# Patient Record
Sex: Male | Born: 2000
Health system: Southern US, Community
[De-identification: ages and names within clinical notes are randomized; demographics above are authoritative.]

## PROBLEM LIST (undated history)

## (undated) DIAGNOSIS — J45909 Unspecified asthma, uncomplicated: Secondary | ICD-10-CM

## (undated) DIAGNOSIS — K219 Gastro-esophageal reflux disease without esophagitis: Secondary | ICD-10-CM

## (undated) HISTORY — DX: Gastro-esophageal reflux disease without esophagitis: K21.9

## (undated) HISTORY — PX: TYMPANOSTOMY TUBE PLACEMENT: SHX32

## (undated) HISTORY — PX: TONSILLECTOMY: SUR1361

## (undated) HISTORY — PX: ADENOIDECTOMY: SUR15

## (undated) HISTORY — DX: Unspecified asthma, uncomplicated: J45.909

---
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
## 2019-01-05 DIAGNOSIS — G4730 Sleep apnea, unspecified: Secondary | ICD-10-CM | POA: Diagnosis not present

## 2019-01-05 NOTE — Procedures (Signed)
Patient Name: Tyler Strickland, Tyler Strickland Date: 12/20/2018 Gender: Male D.O.B: Jul 14, 2001 Age (years): 17 Referring Provider: Maree Erie Height (inches): 72 Interpreting Physician: Jetty Duhamel MD, ABSM Weight (lbs): 320 RPSGT: Rolene Arbour BMI: 43 MRN: [id number] Neck Size: 19.00  CLINICAL INFORMATION Sleep Study Type: NPSG Indication for sleep study: Snoring Epworth Sleepiness Score: 8  SLEEP STUDY TECHNIQUE As per the AASM Manual for the Scoring of Sleep and Associated Events v2.3 (April 2016) with a hypopnea requiring 4% desaturations.  The channels recorded and monitored were frontal, central and occipital EEG, electrooculogram (EOG), submentalis EMG (chin), nasal and oral airflow, thoracic and abdominal wall motion, anterior tibialis EMG, snore microphone, electrocardiogram, and pulse oximetry.  MEDICATIONS Medications self-administered by patient taken the night of the study : none reported  SLEEP ARCHITECTURE The study was initiated at 9:51:37 PM and ended at 4:30:38 AM.  Sleep onset time was 70.4 minutes and the sleep efficiency was 62.9%%. The total sleep time was 251.1 minutes.  Stage REM latency was 86.0 minutes.  The patient spent 2.8%% of the night in stage N1 sleep, 70.7%% in stage N2 sleep, 0.0%% in stage N3 and 26.5% in REM.  Alpha intrusion was absent.  Supine sleep was 87.65%.  RESPIRATORY PARAMETERS The overall apnea/hypopnea index (AHI) was 4.8 per hour. There were 0 total apneas, including 0 obstructive, 0 central and 0 mixed apneas. There were 20 hypopneas and 10 RERAs.  The AHI during Stage REM sleep was 13.5 per hour.  AHI while supine was 3.5 per hour.  The mean oxygen saturation was 96.7%. The minimum SpO2 during sleep was 86.0%.  moderate snoring was noted during this study.  CARDIAC DATA The 2 lead EKG demonstrated sinus rhythm. The mean heart rate was 76.3 beats per minute. Other EKG findings include: None.  LEG MOVEMENT  DATA The total PLMS were 0 with a resulting PLMS index of 0.0. Associated arousal with leg movement index was 0.0 .  IMPRESSIONS - Occasional obstructive sleep apneas occurred during this study, within normal limits (AHI = 4.8/h). Most events were in REM. - No significant central sleep apnea occurred during this study (CAI = 0.0/h). - Mild oxygen desaturation was noted during this study (Min O2 = 86.0%).Mean O2 sat 96.7%. - The patient snored with moderate snoring volume. - No cardiac abnormalities were noted during this study. - Clinically significant periodic limb movements did not occur during sleep. No significant associated arousals.  DIAGNOSIS - Prinmary snoring  RECOMMENDATIONS - An AHI of 4.8/ hr is at the upper limit of normal. This patient may have mild OSA on some nights, if supine, very tired or with nasal congestion. - Suggest emphasis on sleep position off back, weight loss. Consider retest in the future if clinically indicated. - Be careful with alcohol, sedatives and other CNS depressants that may worsen sleep apnea and disrupt normal sleep architecture. - Sleep hygiene should be reviewed to assess factors that may improve sleep quality. - Weight management and regular exercise should be initiated or continued if appropriate.  [Electronically signed] 01/05/2019 11:55 AM  Jetty Duhamel MD, ABSM Diplomate, American Board of Sleep Medicine   NPI: [id number]                          Jetty Duhamel Diplomate, American Board of Sleep Medicine  ELECTRONICALLY SIGNED ON:  01/05/2019, 11:48 AM [facility] PH: [phone]   FX: [phone] ACCREDITED BY THE AMERICAN ACADEMY  OF SLEEP MEDICINE

## 2019-01-15 ENCOUNTER — Ambulatory Visit (INDEPENDENT_AMBULATORY_CARE_PROVIDER_SITE_OTHER): Payer: PRIVATE HEALTH INSURANCE | Admitting: Pediatrics

## 2019-01-15 ENCOUNTER — Other Ambulatory Visit: Payer: Self-pay

## 2019-01-15 DIAGNOSIS — J302 Other seasonal allergic rhinitis: Secondary | ICD-10-CM | POA: Diagnosis not present

## 2019-01-15 MED ORDER — OLOPATADINE HCL 0.2 % OP SOLN: 1.0000 [drp] | Freq: Every day | OPHTHALMIC | 5 refills | Status: AC

## 2019-01-15 MED ORDER — MONTELUKAST SODIUM 10 MG PO TABS: 10.0000 mg | ORAL_TABLET | Freq: Every day | ORAL | 2 refills | Status: AC

## 2019-01-15 MED FILL — MONTELUKAST SOD 10 MG TAB: 10 | 30 days supply | Qty: 30 | Fill #0

## 2019-01-15 NOTE — Progress Notes (Signed)
Virtual Visit via Telephone Note  I connected with Tyler T Ruslan Brotman 's Strickland  on 01/15/19 at  3:30 PM EDT by telephone and verified that I am speaking with the correct person using two identifiers. Location of patient/parent: phone   I discussed the limitations, risks, security and privacy concerns of performing an evaluation and management service by telephone and the availability of in person appointments. I discussed that the purpose of this phone visit is to provide medical care while limiting exposure to the novel coronavirus.  I also discussed with the patient that there may be a patient responsible charge related to this service. The Strickland expressed understanding and agreed to proceed.  Reason for visit: allergies  History of Present Illness:  Every year patient gets the same symptoms during the spring season  Itchy red eyes Sneezing Congested claritin and zyretec dont work Benadryl is the only thing that works but Mom is concerned about its sedating effects.   no fevers No known contacts to coronavirus   Assessment and Plan:  18 yo M with history of seasonal allergies currently with exacerbation Not responsive to first line antihistamines in past  Will try singulair and ophthalmic antihistamine for symptomatic control Follow up precautions reviewed.   Meds ordered this encounter  Medications  . montelukast (SINGULAIR) 10 MG tablet    Sig: Take 1 tablet (10 mg total) by mouth at bedtime.    Dispense:  30 tablet    Refill:  2  . Olopatadine HCl 0.2 % SOLN    Sig: Apply 1 drop to eye daily.    Dispense:  2.5 mL    Refill:  5     Follow Up Instructions: PRN   I discussed the assessment and treatment plan with the patient and/or parent/guardian. They were provided an opportunity to ask questions and all were answered. They agreed with the plan and demonstrated an understanding of the instructions.   They were advised to call back or seek an in-person evaluation  in the emergency room if the symptoms worsen or if the condition fails to improve as anticipated.  I provided 7 minutes of non-face-to-face time during this encounter. I was located at home office during this encounter.  Ancil Linsey, MD

## 2019-02-06 ENCOUNTER — Telehealth: Payer: Self-pay | Admitting: *Deleted

## 2019-02-06 ENCOUNTER — Encounter: Payer: Self-pay | Admitting: *Deleted

## 2019-02-06 NOTE — Telephone Encounter (Signed)
He had a new patient appt on 02/10/2019 at 7:30am.  Due to COVID-19, our office hours have changed and this appt had to be canceled.  I called him to reschedule.  No answer on his mobile number x 2 and voicemail full.  His mychart sign-up is still pending so not able to email him.  I left a message on his listed home number to let him know the appt has been canceled and provided our number to call back to reschedule.  When he calls back, please offer him a new appt time with Dr. Terrace Arabia.

## 2019-02-10 ENCOUNTER — Ambulatory Visit: Payer: No Typology Code available for payment source | Admitting: Neurology

## 2019-04-07 IMAGING — CT CT HEAD W/O CM
3 of 4 series · 14 of 47 positions shown, 16 images · non-contrast
Comparison: None.

CLINICAL DATA: Headaches and cysts head head collision playing
football [REDACTED] evening.

EXAM:
CT HEAD WITHOUT CONTRAST
TECHNIQUE: Contiguous axial images were obtained from the base of the skull
through the vertex without intravenous contrast.

[Series 2: head w/o · axial · non-contrast · 0.45mm/px · z∈[+1440,+1560]mm · 8 of 30 slices shown, 10 images]
[im 3/30  brain]
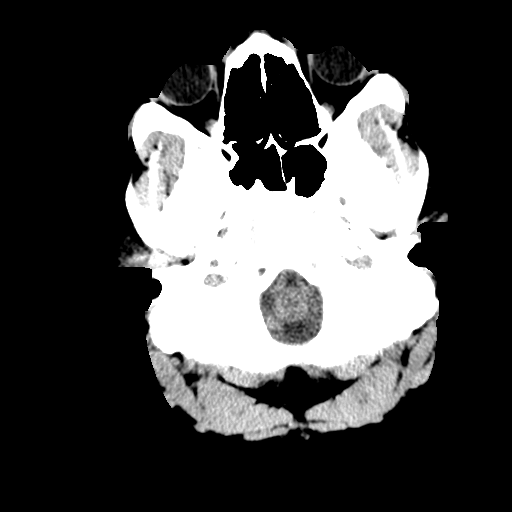
[im 3/30  bone]
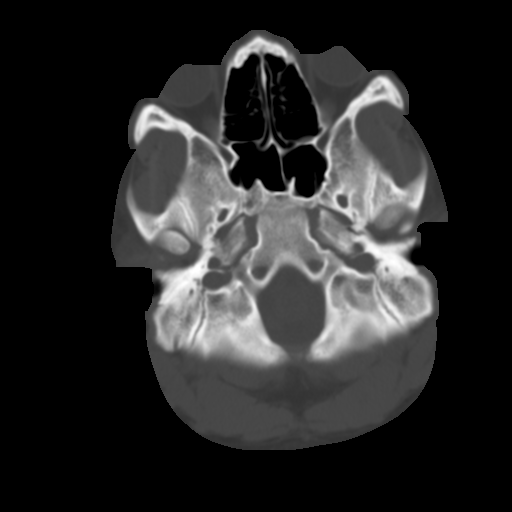
[im 7/30  brain]
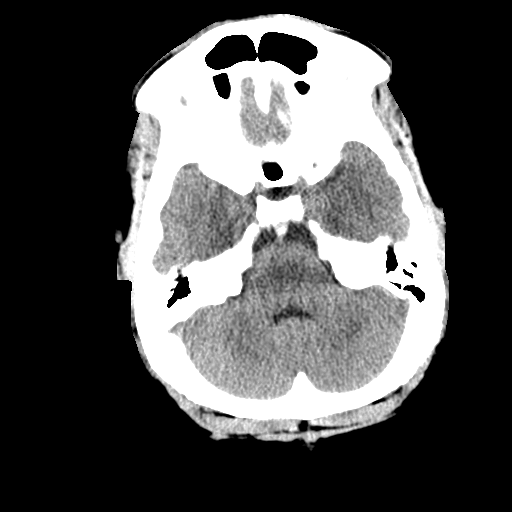
[im 11/30  brain]
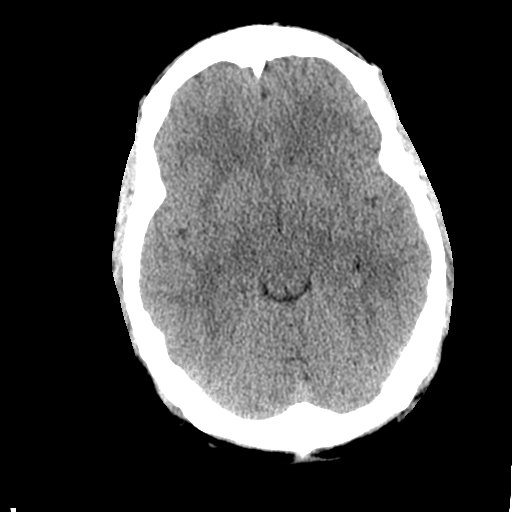
[im 13/30  brain]
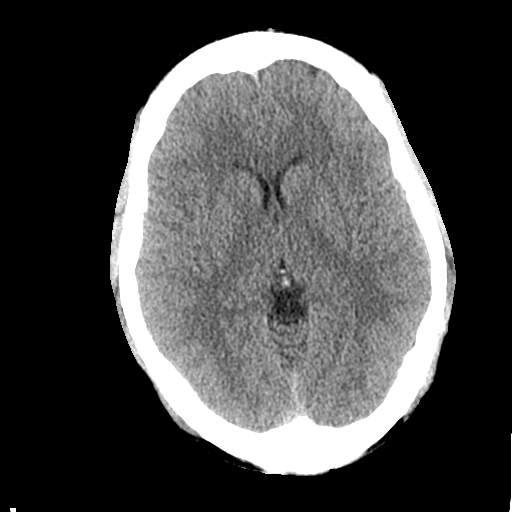
[im 17/30  brain]
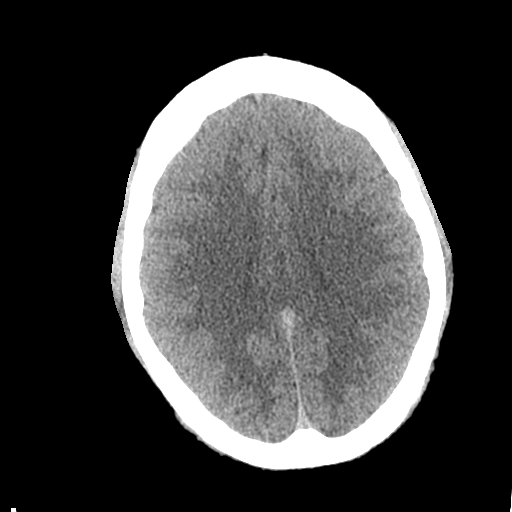
[im 17/30  bone]
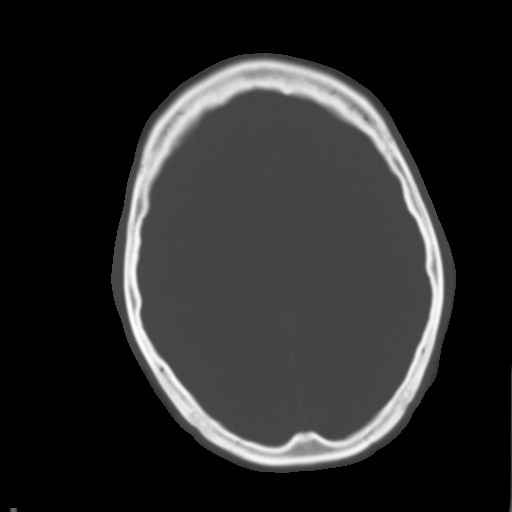
[im 19/30  brain]
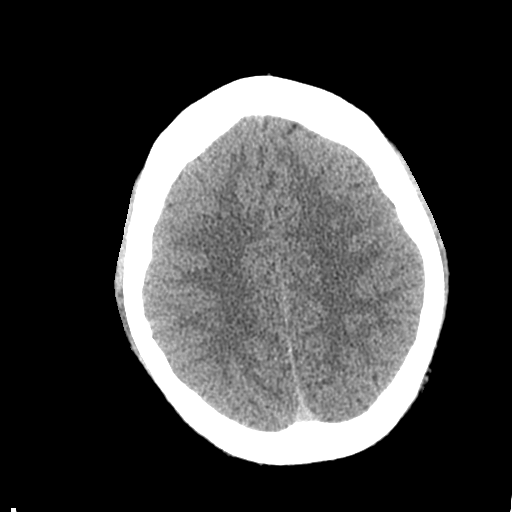
[im 23/30  brain]
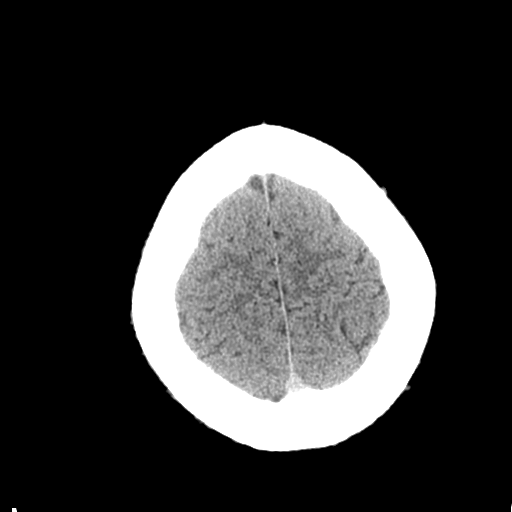
[im 27/30  brain]
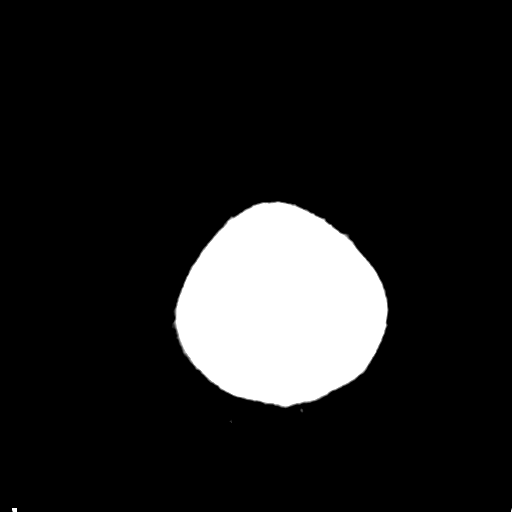

[Series 5: coronal · coronal · 0.29mm/px · 3 of 77 slices shown]
[im 26/77  brain]
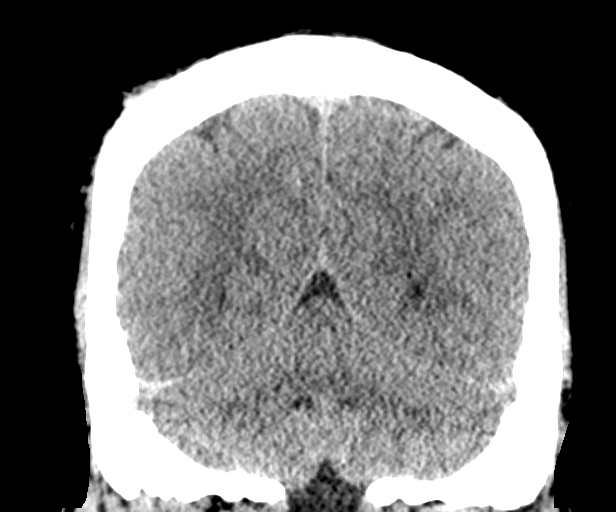
[im 34/77  brain]
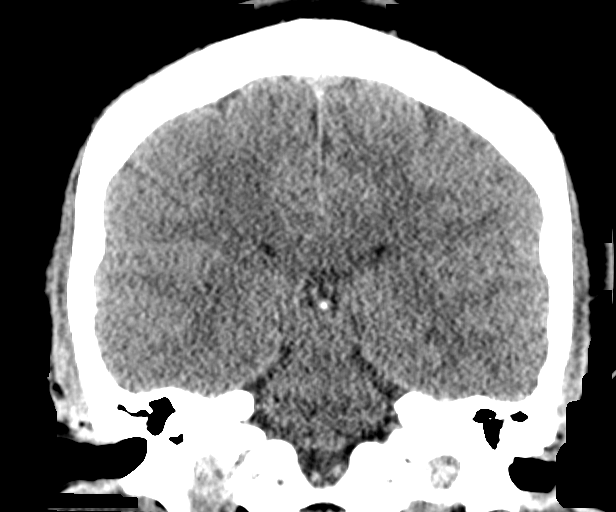
[im 43/77  brain]
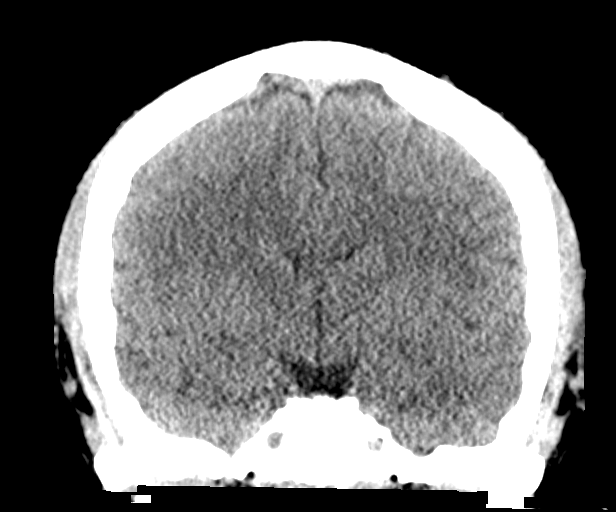

[Series 6: sagittal · sagittal · 0.29mm/px · 3 of 60 slices shown]
[im 20/60  brain]
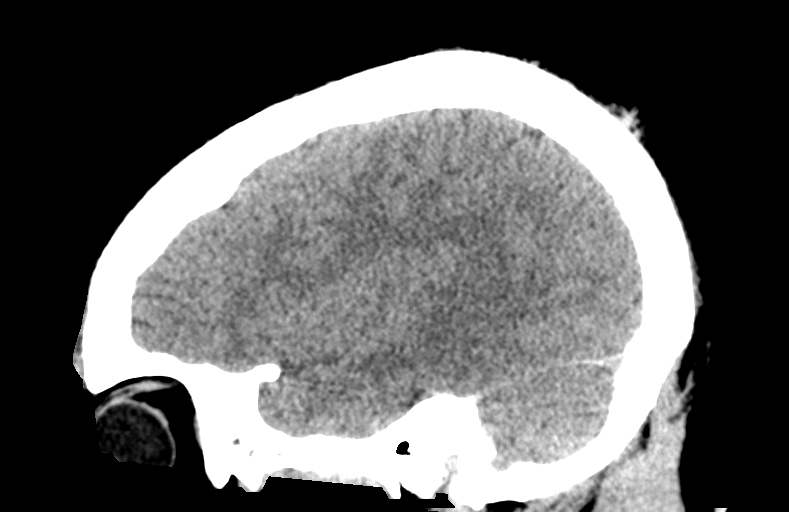
[im 30/60  brain]
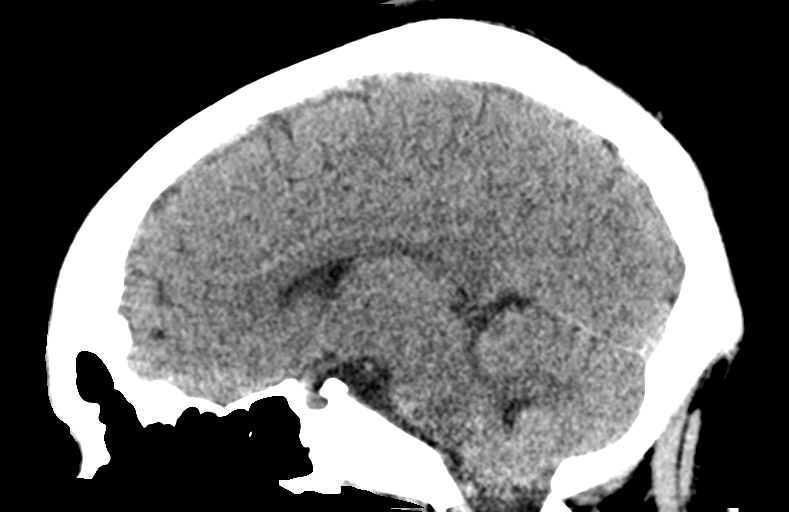
[im 40/60  brain]
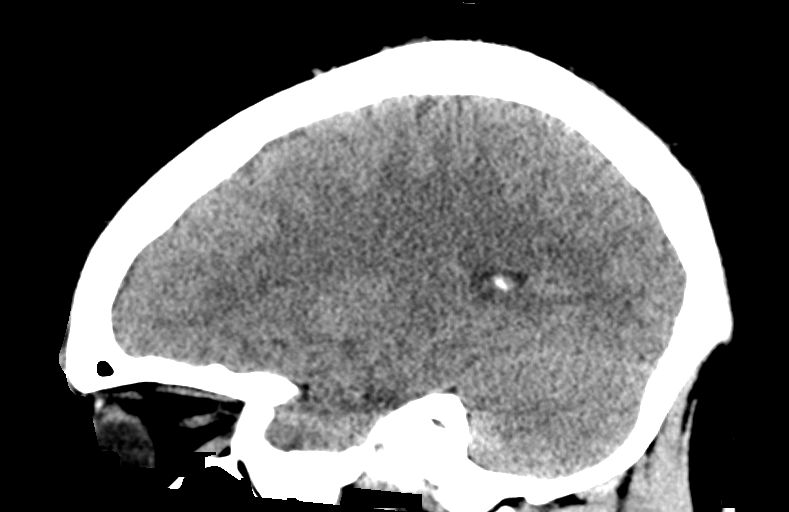

[14 of 47 positions shown; findings below may reference images not displayed]

FINDINGS: Brain: The ventricles are normal in size and configuration. No
extra-axial fluid collections are identified. The gray-white
differentiation is normal. No CT findings for acute intracranial
process such as hemorrhage or infarction. No mass lesions. The
brainstem and cerebellum are grossly normal.

Vascular: No hyperdense vessel or unexpected calcification.

Skull: No skull fracture or bone lesion.

Sinuses/Orbits: The paranasal sinuses and mastoid air cells are
clear except for small amount of debris in a posterior ethmoid air
cell on the left. The globes are intact.

Other: No scalp hematoma or laceration.
IMPRESSION: No acute intracranial findings or skull fracture.

## 2019-08-02 ENCOUNTER — Ambulatory Visit: Payer: Medicaid Other

## 2019-12-15 ENCOUNTER — Ambulatory Visit (HOSPITAL_COMMUNITY)
Admission: EM | Admit: 2019-12-15 | Discharge: 2019-12-15 | Disposition: A | Discharge status: Other Acute Inpatient Hospital | Payer: 59

## 2020-06-10 ENCOUNTER — Encounter: Payer: Self-pay | Admitting: Pediatrics
